# Patient Record
Sex: Male | Born: 1990 | Race: White | Hispanic: Yes | Marital: Married
Health system: Southern US, Community
[De-identification: ages and names within clinical notes are randomized; demographics above are authoritative.]

---

## 2013-08-28 ENCOUNTER — Ambulatory Visit (HOSPITAL_BASED_OUTPATIENT_CLINIC_OR_DEPARTMENT_OTHER)
Admission: RE | Admit: 2013-08-28 | Discharge: 2013-08-28 | Disposition: A | Discharge status: Home / Self Care | Payer: Managed Care, Other (non HMO) | Source: Ambulatory Visit | Attending: Emergency Medicine | Admitting: Emergency Medicine

## 2013-08-28 ENCOUNTER — Other Ambulatory Visit (HOSPITAL_BASED_OUTPATIENT_CLINIC_OR_DEPARTMENT_OTHER): Payer: Self-pay | Admitting: Emergency Medicine

## 2013-08-28 DIAGNOSIS — R52 Pain, unspecified: Secondary | ICD-10-CM

## 2013-08-28 DIAGNOSIS — R1032 Left lower quadrant pain: Secondary | ICD-10-CM | POA: Insufficient documentation

## 2013-08-28 DIAGNOSIS — K5289 Other specified noninfective gastroenteritis and colitis: Secondary | ICD-10-CM | POA: Diagnosis not present

## 2013-08-28 DIAGNOSIS — N2889 Other specified disorders of kidney and ureter: Secondary | ICD-10-CM | POA: Diagnosis not present

## 2016-01-30 IMAGING — CT CT ABD-PELV W/O CM
2 of 4 series · 16 of 46 positions shown, 18 images · non-contrast
Comparison: None.

CLINICAL DATA: Left lower quadrant pain for 4 days.

EXAM:
CT ABDOMEN AND PELVIS WITHOUT CONTRAST
TECHNIQUE: Multidetector CT imaging of the abdomen and pelvis was performed
following the standard protocol without IV contrast.

[Series 2: renal stone < 200 lbs 5.0 b31f · axial · 0.75mm/px · z∈[-510,-24]mm · 13 of 107 slices shown, 15 images]
[im 5/107  soft-tissue]
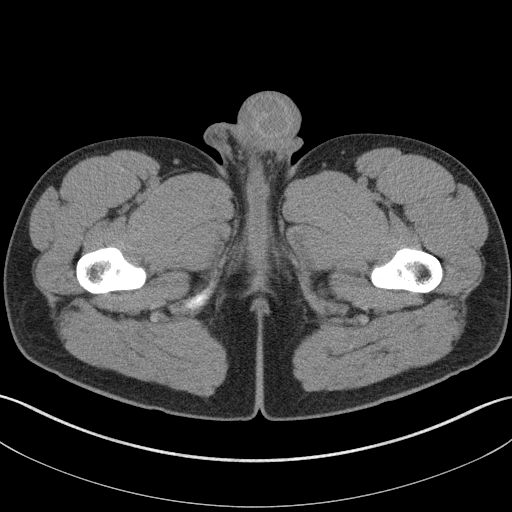
[im 5/107  bone]
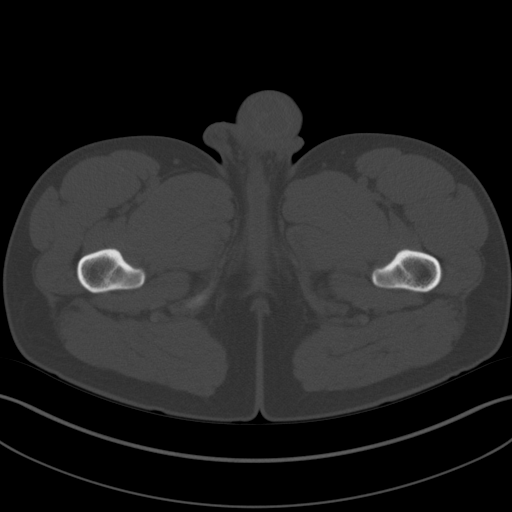
[im 14/107  soft-tissue]
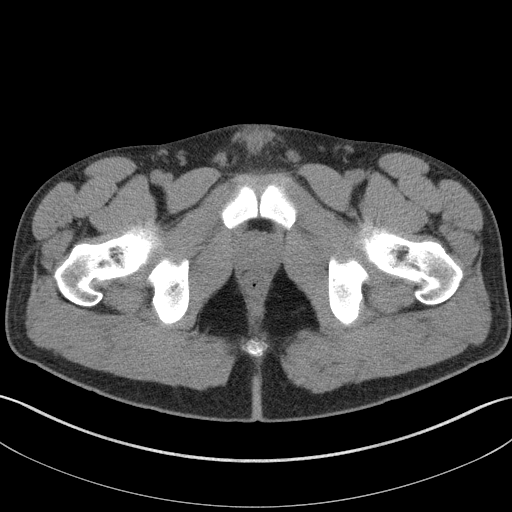
[im 23/107  soft-tissue]
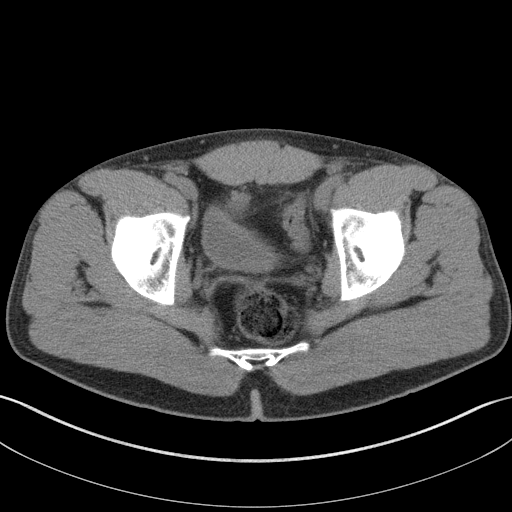
[im 31/107  soft-tissue]
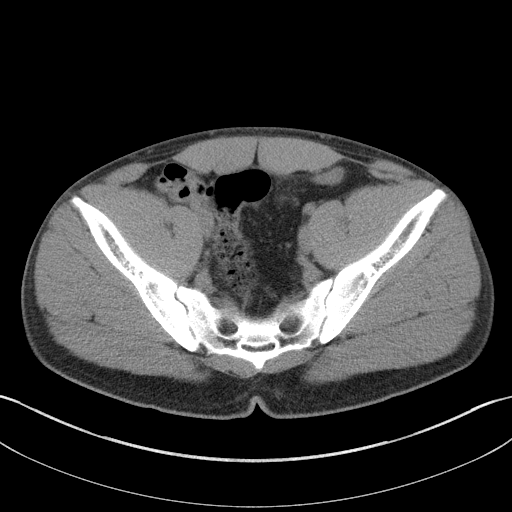
[im 36/107  soft-tissue]
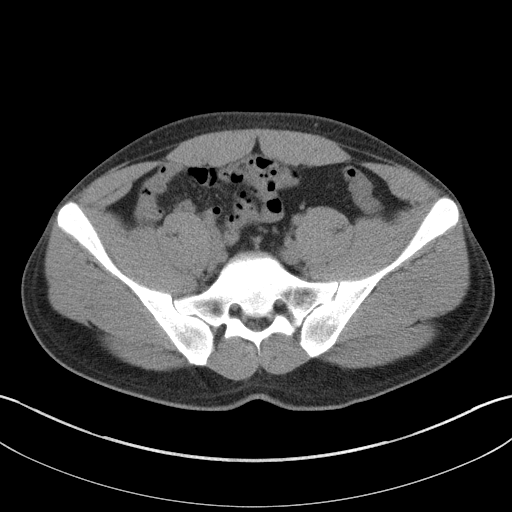
[im 45/107  soft-tissue]
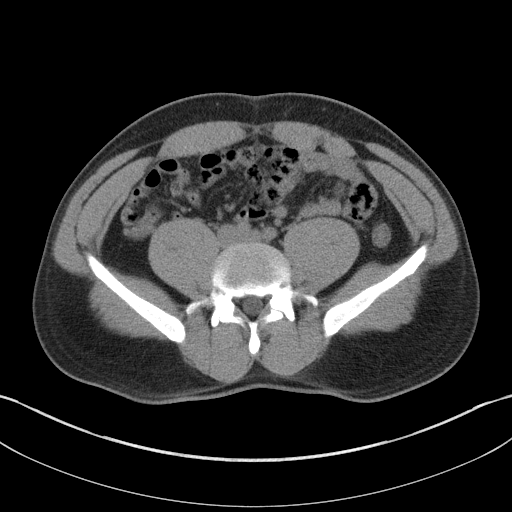
[im 54/107  soft-tissue]
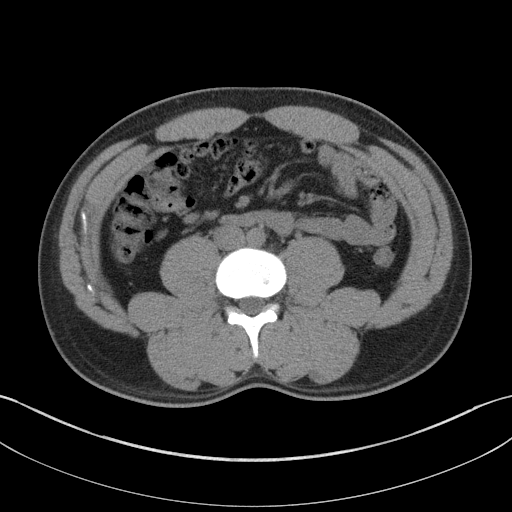
[im 62/107  soft-tissue]
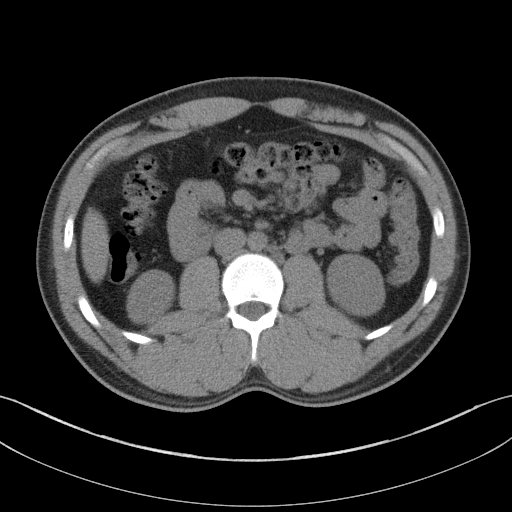
[im 71/107  soft-tissue]
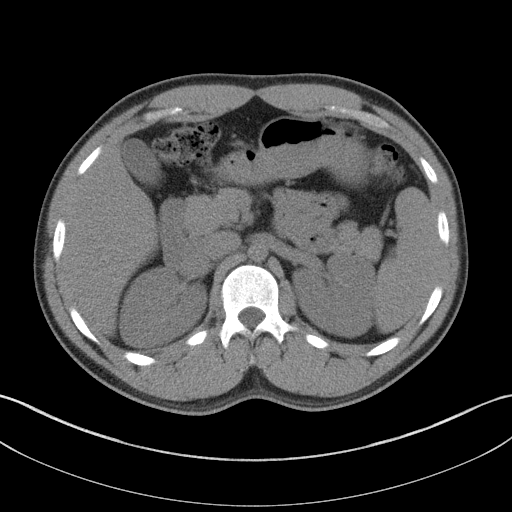
[im 71/107  bone]
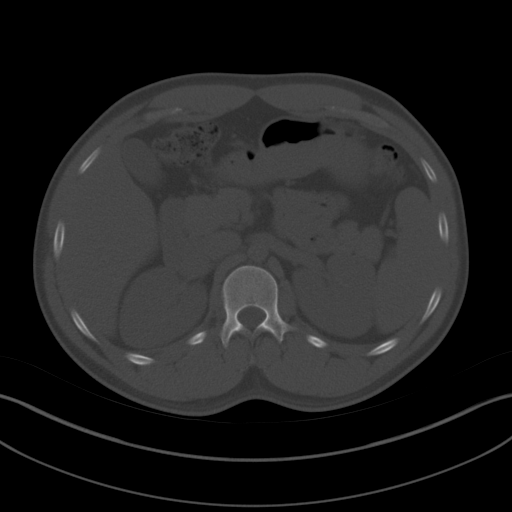
[im 76/107  soft-tissue]
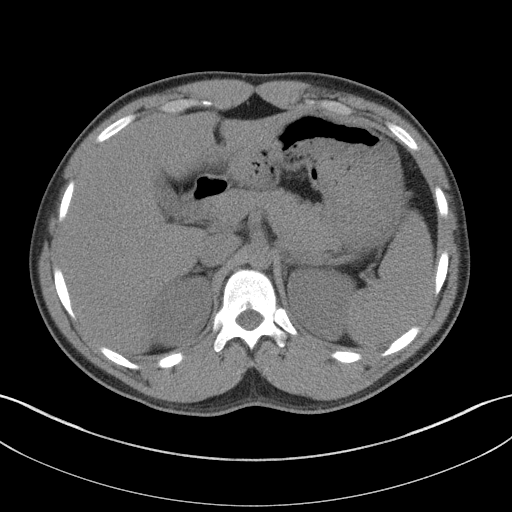
[im 84/107  soft-tissue]
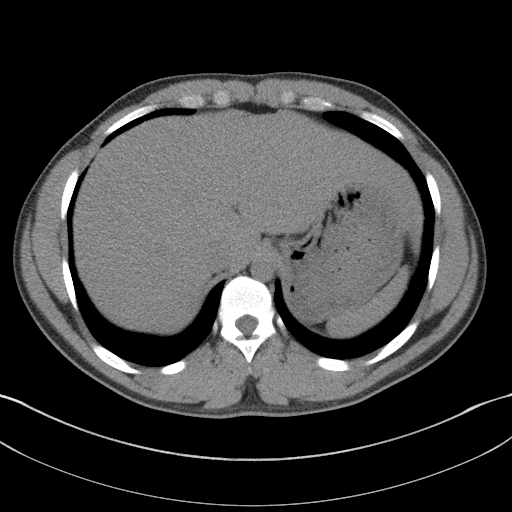
[im 93/107  soft-tissue]
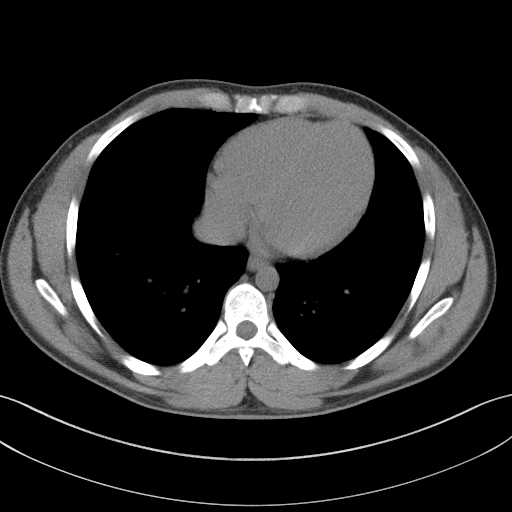
[im 102/107  soft-tissue]
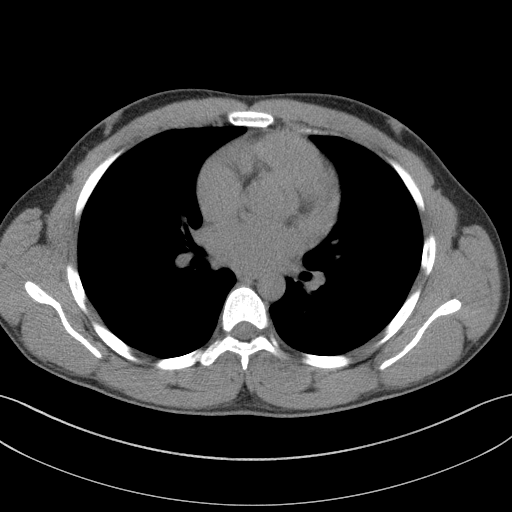

[Series 5: renal stone 3.0 coronal · coronal · 0.72mm/px · 3 of 79 slices shown]
[im 27/79  soft-tissue]
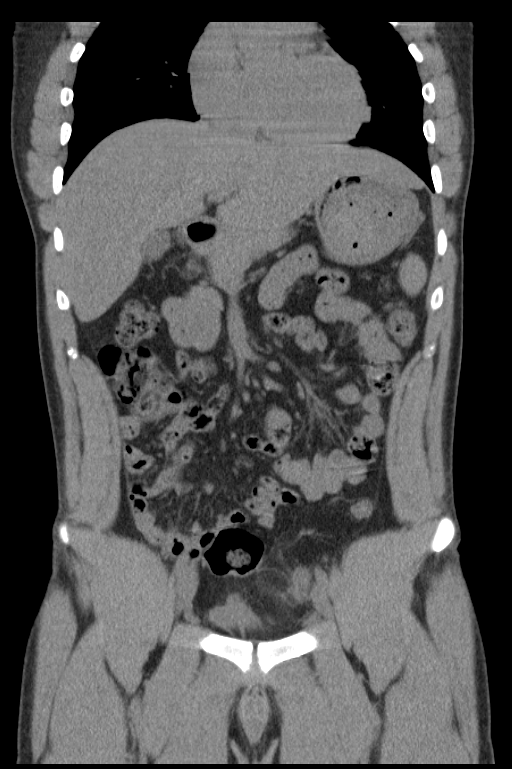
[im 35/79  soft-tissue]
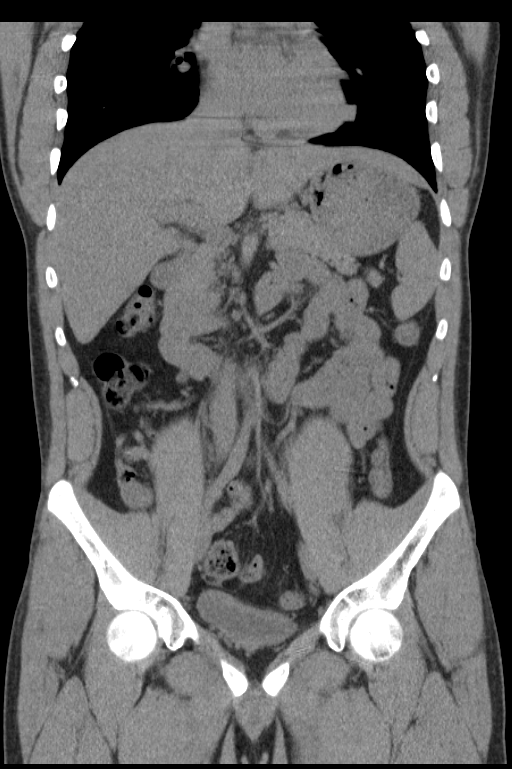
[im 44/79  soft-tissue]
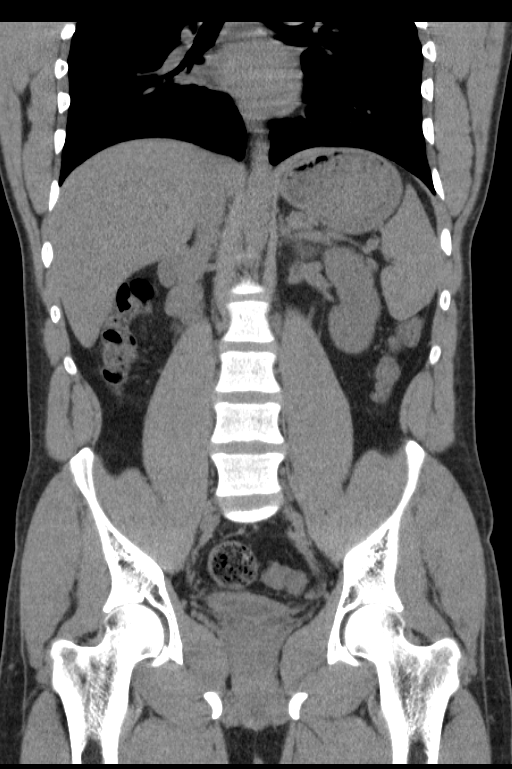

[16 of 46 positions shown; findings below may reference images not displayed]

FINDINGS: There is a focal area of soft tissue stranding around an epiploic
appendage adjacent to the sigmoid portion of the colon in the left
side of the pelvis just anterior and superior to the left side of
the bladder. There are no adjacent diverticula.

Liver, biliary tree, spleen, pancreas, and adrenal glands are
normal. There are subtle tiny calcifications in both kidneys,
possibly representing medullary sponge kidney. There is no ureteral
or bladder stone. No hydronephrosis.

There are no dilated loops of bowel. Terminal ileum and appendix are
normal. No osseous abnormality.
IMPRESSION: 1. Subtle inflammation in the soft tissues adjacent to the sigmoid
portion of the colon in the left side of the pelvis consistent with
inflammation of an epiploic appendagitis.
2. Several tiny calcifications in each kidney, consistent with
medullary sponge kidney.
